# Patient Record
Sex: Female | Born: 1995 | Race: White | Hispanic: No | Marital: Single | Smoking: Never smoker
Health system: Southern US, Community
[De-identification: ages and names within clinical notes are randomized; demographics above are authoritative.]

## PROBLEM LIST (undated history)

## (undated) DIAGNOSIS — J45909 Unspecified asthma, uncomplicated: Secondary | ICD-10-CM

## (undated) DIAGNOSIS — F988 Other specified behavioral and emotional disorders with onset usually occurring in childhood and adolescence: Secondary | ICD-10-CM

## (undated) HISTORY — PX: TYMPANOSTOMY TUBE PLACEMENT: SHX32

## (undated) HISTORY — DX: Other specified behavioral and emotional disorders with onset usually occurring in childhood and adolescence: F98.8

---
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
## 2016-01-07 ENCOUNTER — Other Ambulatory Visit: Payer: Self-pay | Admitting: Nurse Practitioner

## 2016-01-07 NOTE — Telephone Encounter (Signed)
She needs a 3 month recheck - 1 pack can be given until she returns.

## 2016-01-07 NOTE — Telephone Encounter (Signed)
Left detailed  message to call regarding refill request, patient needs follow up before any additional refills -eh

## 2016-01-07 NOTE — Telephone Encounter (Signed)
Medication refill request: OCP Last AEX:  10-30-15  Next AEX: not scheduled  Last MMG (if hormonal medication request): N/A Refill authorized: please advise

## 2016-01-17 ENCOUNTER — Encounter: Payer: Self-pay | Admitting: Certified Nurse Midwife

## 2016-01-17 ENCOUNTER — Ambulatory Visit (INDEPENDENT_AMBULATORY_CARE_PROVIDER_SITE_OTHER): Payer: BLUE CROSS/BLUE SHIELD | Admitting: Certified Nurse Midwife

## 2016-01-17 VITALS — BP 110/70 | HR 80 | Resp 16 | Ht 68.5 in | Wt 131.0 lb

## 2016-01-17 DIAGNOSIS — R300 Dysuria: Secondary | ICD-10-CM | POA: Diagnosis not present

## 2016-01-17 DIAGNOSIS — N3000 Acute cystitis without hematuria: Secondary | ICD-10-CM

## 2016-01-17 DIAGNOSIS — Z3041 Encounter for surveillance of contraceptive pills: Secondary | ICD-10-CM

## 2016-01-17 LAB — POCT URINALYSIS DIPSTICK
Bilirubin, UA: NEGATIVE
Blood, UA: NEGATIVE
Glucose, UA: NEGATIVE
Ketones, UA: NEGATIVE
Nitrite, UA: POSITIVE
Urobilinogen, UA: NEGATIVE
pH, UA: 7

## 2016-01-17 MED ORDER — NORETHIN ACE-ETH ESTRAD-FE 1-20 MG-MCG PO TABS: 1.0000 | ORAL_TABLET | Freq: Every day | ORAL | 3 refills | Status: DC

## 2016-01-17 MED ORDER — NITROFURANTOIN MONOHYD MACRO 100 MG PO CAPS: 100.0000 mg | ORAL_CAPSULE | Freq: Two times a day (BID) | ORAL | 0 refills | Status: DC

## 2016-01-17 MED ORDER — NORETHIN ACE-ETH ESTRAD-FE 1-20 MG-MCG PO TABS: 1.0000 | ORAL_TABLET | Freq: Every day | ORAL | 0 refills | Status: DC

## 2016-01-17 MED ORDER — PHENAZOPYRIDINE HCL 100 MG PO TABS: 100.0000 mg | ORAL_TABLET | Freq: Three times a day (TID) | ORAL | 0 refills | Status: DC | PRN

## 2016-01-17 NOTE — Patient Instructions (Signed)
Urinary Tract Infection, Adult Introduction A urinary tract infection (UTI) is an infection of any part of the urinary tract. The urinary tract includes the:  Kidneys.  Ureters.  Bladder.  Urethra. These organs make, store, and get rid of pee (urine) in the body. Follow these instructions at home:  Take over-the-counter and prescription medicines only as told by your doctor.  If you were prescribed an antibiotic medicine, take it as told by your doctor. Do not stop taking the antibiotic even if you start to feel better.  Avoid the following drinks:  Alcohol.  Caffeine.  Tea.  Carbonated drinks.  Drink enough fluid to keep your pee clear or pale yellow.  Keep all follow-up visits as told by your doctor. This is important.  Make sure to:  Empty your bladder often and completely. Do not to hold pee for long periods of time.  Empty your bladder before and after sex.  Wipe from front to back after a bowel movement if you are female. Use each tissue one time when you wipe. Contact a doctor if:  You have back pain.  You have a fever.  You feel sick to your stomach (nauseous).  You throw up (vomit).  Your symptoms do not get better after 3 days.  Your symptoms go away and then come back. Get help right away if:  You have very bad back pain.  You have very bad lower belly (abdominal) pain.  You are throwing up and cannot keep down any medicines or water. This information is not intended to replace advice given to you by your health care provider. Make sure you discuss any questions you have with your health care provider. Document Released: 06/25/2007 Document Revised: 06/14/2015 Document Reviewed: 11/27/2014  2017 Elsevier  

## 2016-01-17 NOTE — Progress Notes (Signed)
20 y.o.Single Caucasian female G0P0000 here with complaint of UTI, with onset  on 1 week. Patient complaining of:  dysuria. Patient denies fever, chills, nausea or back pain. No new personal products. Patient feels not related to sexual activity. Denies vaginal symptoms.    Contraception is OCP's and condom.  No change in partner. Patient has adequate water intake. Patient took Uricalm yesterday. Patient also here for follow up of OCP initiation 3 months ago. Denies any missed pills, warning signs or side effects with use. Periods 3 day duration, no cramping, light to moderate flow. Happy with choice. Will need one pack until back in [place] at college. No other health issues today.   O: Healthy female WDWN Affect: Normal, orientation x 3 Skin : warm and dry CVAT: negative bilateral Abdomen: poisitive for suprapubic tenderness  Pelvic exam: External genital area: normal, no lesions Bladder,Urethra tender, Urethral meatus: normal Vagina: normal vaginal discharge, normal appearance Cervix: normal, non tender Uterus:normal,non tender Adnexa: normal non tender, no fullness or masses  POCT:  Positive nitrites, 1+ WBC  A:  UTI  Normal pelvic exam  OCP surveillance  P: Reviewed findings of UTI, etiology, and need for treatment. Rx:  Macrobid see order with instructions Rx Pyridium see order with instructions JXB:JYNWGLab:Urine  Culture only Reviewed warning signs and symptoms of UTI and need to advise if occurring. Encouraged to limit soda, tea, and coffee and increase water intake. Reviewed warning signs with OCP and need to advise if occurs. Rx Junel 1/20 Fe see order one pack sent to local pharmacy and [facility]   RV prn

## 2016-01-18 NOTE — Progress Notes (Signed)
Reviewed personally.  M. Suzanne Daemien Fronczak, MD.  

## 2016-01-19 LAB — URINE CULTURE: Colony Count: >=100000

## 2017-06-04 ENCOUNTER — Other Ambulatory Visit: Payer: Self-pay | Admitting: Certified Nurse Midwife

## 2017-06-04 DIAGNOSIS — Z3041 Encounter for surveillance of contraceptive pills: Secondary | ICD-10-CM

## 2017-06-16 ENCOUNTER — Ambulatory Visit: Payer: BLUE CROSS/BLUE SHIELD | Admitting: Certified Nurse Midwife

## 2017-06-16 ENCOUNTER — Other Ambulatory Visit: Payer: Self-pay

## 2017-06-16 ENCOUNTER — Encounter: Payer: Self-pay | Admitting: Certified Nurse Midwife

## 2017-06-16 VITALS — BP 118/80 | HR 72 | Resp 16 | Ht 68.25 in | Wt 121.0 lb

## 2017-06-16 DIAGNOSIS — Z3041 Encounter for surveillance of contraceptive pills: Secondary | ICD-10-CM

## 2017-06-16 DIAGNOSIS — Z01419 Encounter for gynecological examination (general) (routine) without abnormal findings: Secondary | ICD-10-CM | POA: Diagnosis not present

## 2017-06-16 MED ORDER — NORETHIN ACE-ETH ESTRAD-FE 1-20 MG-MCG PO TABS: 1.0000 | ORAL_TABLET | Freq: Every day | ORAL | 4 refills | Status: AC

## 2017-06-16 NOTE — Progress Notes (Signed)
22 y.o. G0P0000 Single  Caucasian Fe here for annual exam. Periods normal, no issues. Contraception condoms, but would like to be back on OCP. Prefers Loestrin 1/20 FE. Had stopped  OCP at one point and restarted, now out of Rx. Sees [facility] prn at college.Has had weight loss over the past year with school stress. Eating much better now and feels she has gained some weight back. Sexually active, no partner change, declines STD screening was done at school. No other health issues today. Planning road trip to [place]!  Patient's last menstrual period was 05/26/2017 (exact date).          Sexually active: Yes.    The current method of family planning is condoms most of the time.  Takes ocp's but none in 2 weeks (ran out)  Exercising: Yes.    walking & gym Smoker:  Marijuana use  Health Maintenance: Pap:  none History of Abnormal Pap: no MMG:  none Self Breast exams: yes Colonoscopy:  none BMD:   none TDaP:  2016 Shingles: no Pneumonia: no Hep C and HIV: not done Labs: no   reports that she has never smoked. She has never used smokeless tobacco. She reports that she drinks about 4.2 oz of alcohol per week. She reports that she has current or past drug history. Drug: Marijuana.  Past Medical History:  Diagnosis Date  . ADD (attention deficit disorder)   . Asthma     Past Surgical History:  Procedure Laterality Date  . TYMPANOSTOMY TUBE PLACEMENT      Current Outpatient Medications  Medication Sig Dispense Refill  . albuterol (PROVENTIL HFA;VENTOLIN HFA) 108 (90 BASE) MCG/ACT inhaler Inhale 2 puffs into the lungs every 6 (six) hours as needed for wheezing.    Marland Kitchen ALVESCO 160 MCG/ACT inhaler Inhale 1 puff into the lungs 2 (two) times daily.     . cetirizine (ZYRTEC) 10 MG tablet Take 10 mg by mouth daily.    Marland Kitchen ibuprofen (ADVIL,MOTRIN) 200 MG tablet Take 200 mg by mouth every 6 (six) hours as needed for pain.    . naproxen sodium (ANAPROX) 220 MG tablet Take 220 mg  by mouth 2 (two) times daily with a meal.    . VYVANSE 60 MG capsule Take 60 mg by mouth daily.     . norethindrone-ethinyl estradiol (JUNEL FE,GILDESS FE,LOESTRIN FE) 1-20 MG-MCG tablet Take 1 tablet by mouth daily. (Patient not taking: Reported on 06/16/2017) 3 Package 3   No current facility-administered medications for this visit.     Family History  Problem Relation Age of Onset  . Cervical cancer Mother   . Hypertension Paternal Grandmother   . Lung cancer Paternal Grandmother   . Hypertension Paternal Grandfather     ROS:  Pertinent items are noted in HPI.  Otherwise, a comprehensive ROS was negative.  Exam:   BP 118/80   Pulse 72   Resp 16   Ht 5' 8.25" (1.734 m)   Wt 121 lb (54.9 kg)   LMP 05/26/2017 (Exact Date)   BMI 18.26 kg/m  Height: 5' 8.25" (173.4 cm) Ht Readings from Last 3 Encounters:  06/16/17 5' 8.25" (1.734 m)  01/17/16 5' 8.5" (1.74 m) (95 %, Z= 1.66)*  10/30/15 5' 8.25" (1.734 m) (94 %, Z= 1.56)*   * Growth percentiles are based on CDC (Girls, 2-20 Years) data.    General appearance: alert, cooperative and appears stated age Head: Normocephalic, without obvious abnormality, atraumatic Neck: no adenopathy, supple, symmetrical, trachea  midline and thyroid normal to inspection and palpation Lungs: clear to auscultation bilaterally Breasts: normal appearance, no masses or tenderness, No nipple retraction or dimpling, No nipple discharge or bleeding, No axillary or supraclavicular adenopathy Heart: regular rate and rhythm Abdomen: soft, non-tender; no masses,  no organomegaly Extremities: extremities normal, atraumatic, no cyanosis or edema Skin: Skin color, texture, turgor normal. No rashes or lesions Lymph nodes: Cervical, supraclavicular, and axillary nodes normal. No abnormal inguinal nodes palpated Neurologic: Grossly normal   Pelvic: External genitalia:  no lesions              Urethra:  normal appearing urethra with no masses, tenderness or  lesions              Bartholin's and Skene's: normal                 Vagina: normal appearing vagina with normal color and discharge, no lesions              Cervix: no cervical motion tenderness, no lesions and nulliparous appearance              Pap taken: No. Bimanual Exam:  Uterus:  normal size, contour, position, consistency, mobility, non-tender and anteverted              Adnexa: normal adnexa and no mass, fullness, tenderness               Rectovaginal: Confirms               Anus:  normal appearance  Chaperone present: yes  A:  Well Woman with normal exam  Contraception OCP desired  Vyvanse/asthma with MD management  Weight loss with college stress  P:   Reviewed health and wellness pertinent to exam  Risks/benefits/warning signs of OCP reviewed, desires continuance. Discussed importance of consistent use for protection and cycle control.  Rx Loestrin 1/20 Fe preferred See order with instructions  Continue follow up with MD as indicated  Discussed importance of good diet and health/well being.  Pap smear: no   counseled on breast self exam, STD prevention, HIV risk factors and prevention, use and side effects of OCP's, adequate intake of calcium and vitamin D, diet and exercise  return annually or prn  An After Visit Summary was printed and given to the patient.

## 2017-06-16 NOTE — Patient Instructions (Addendum)
General topics  Next pap or exam is  due in 1 year Take a Women's multivitamin Take 1200 mg. of calcium daily - prefer dietary If any concerns in interim to call back  Breast Self-Awareness Practicing breast self-awareness may pick up problems early, prevent significant medical complications, and possibly save your life. By practicing breast self-awareness, you can become familiar with how your breasts look and feel and if your breasts are changing. This allows you to notice changes early. It can also offer you some reassurance that your breast health is good. One way to learn what is normal for your breasts and whether your breasts are changing is to do a breast self-exam. If you find a lump or something that was not present in the past, it is best to contact your caregiver right away. Other findings that should be evaluated by your caregiver include nipple discharge, especially if it is bloody; skin changes or reddening; areas where the skin seems to be pulled in (retracted); or new lumps and bumps. Breast pain is seldom associated with cancer (malignancy), but should also be evaluated by a caregiver. BREAST SELF-EXAM The best time to examine your breasts is 5 7 days after your menstrual period is over.  ExitCare Patient Information 2013 [employer].   Exercise to Stay Healthy Exercise helps you become and stay healthy. EXERCISE IDEAS AND TIPS Choose exercises that:  You enjoy.  Fit into your day. You do not need to exercise really hard to be healthy. You can do exercises at a slow or medium level and stay healthy. You can:  Stretch before and after working out.  Try yoga, Pilates, or tai chi.  Lift weights.  Walk fast, swim, jog, run, climb stairs, bicycle, dance, or rollerskate.  Take aerobic classes. Exercises that burn about 150 calories:  Running 1  miles in 15 minutes.  Playing volleyball for 45 to 60 minutes.  Washing and waxing a car for 45 to 60  minutes.  Playing touch football for 45 minutes.  Walking 1  miles in 35 minutes.  Pushing a stroller 1  miles in 30 minutes.  Playing basketball for 30 minutes.  Raking leaves for 30 minutes.  Bicycling 5 miles in 30 minutes.  Walking 2 miles in 30 minutes.  Dancing for 30 minutes.  Shoveling snow for 15 minutes.  Swimming laps for 20 minutes.  Walking up stairs for 15 minutes.  Bicycling 4 miles in 15 minutes.  Gardening for 30 to 45 minutes.  Jumping rope for 15 minutes.  Washing windows or floors for 45 to 60 minutes. Document Released: 02/08/2010 Document Revised: 03/31/2011 Document Reviewed: 02/08/2010 [employer] Patient Information 2013 [employer].   Other topics ( that may be useful information):    Sexually Transmitted Disease Sexually transmitted disease (STD) refers to any infection that is passed from person to person during sexual activity. This may happen by way of saliva, semen, blood, vaginal mucus, or urine. Common STDs include:  Gonorrhea.  Chlamydia.  Syphilis.  HIV/AIDS.  Genital herpes.  Hepatitis B and C.  Trichomonas.  Human papillomavirus (HPV).  Pubic lice. CAUSES  An STD may be spread by bacteria, virus, or parasite. A person can get an STD by:  Sexual intercourse with an infected person.  Sharing sex toys with an infected person.  Sharing needles with an infected person.  Having intimate contact with the genitals, mouth, or rectal areas of an infected person. SYMPTOMS  Some people may not have any symptoms, but   they can still pass the infection to others. Different STDs have different symptoms. Symptoms include:  Painful or bloody urination.  Pain in the pelvis, abdomen, vagina, anus, throat, or eyes.  Skin rash, itching, irritation, growths, or sores (lesions). These usually occur in the genital or anal area.  Abnormal vaginal discharge.  Penile discharge in men.  Soft, flesh-colored skin growths in the  genital or anal area.  Fever.  Pain or bleeding during sexual intercourse.  Swollen glands in the groin area.  Yellow skin and eyes (jaundice). This is seen with hepatitis. DIAGNOSIS  To make a diagnosis, your caregiver may:  Take a medical history.  Perform a physical exam.  Take a specimen (culture) to be examined.  Examine a sample of discharge under a microscope.  Perform blood test TREATMENT   Chlamydia, gonorrhea, trichomonas, and syphilis can be cured with antibiotic medicine.  Genital herpes, hepatitis, and HIV can be treated, but not cured, with prescribed medicines. The medicines will lessen the symptoms.  Genital warts from HPV can be treated with medicine or by freezing, burning (electrocautery), or surgery. Warts may come back.  HPV is a virus and cannot be cured with medicine or surgery.However, abnormal areas may be followed very closely by your caregiver and may be removed from the cervix, vagina, or vulva through office procedures or surgery. If your diagnosis is confirmed, your recent sexual partners need treatment. This is true even if they are symptom-free or have a negative culture or evaluation. They should not have sex until their caregiver says it is okay. HOME CARE INSTRUCTIONS  All sexual partners should be informed, tested, and treated for all STDs.  Take your antibiotics as directed. Finish them even if you start to feel better.  Only take over-the-counter or prescription medicines for pain, discomfort, or fever as directed by your caregiver.  Rest.  Eat a balanced diet and drink enough fluids to keep your urine clear or pale yellow.  Do not have sex until treatment is completed and you have followed up with your caregiver. STDs should be checked after treatment.  Keep all follow-up appointments, Pap tests, and blood tests as directed by your caregiver.  Only use latex condoms and water-soluble lubricants during sexual activity. Do not use  petroleum jelly or oils.  Avoid alcohol and illegal drugs.  Get vaccinated for HPV and hepatitis. If you have not received these vaccines in the past, talk to your caregiver about whether one or both might be right for you.  Avoid risky sex practices that can break the skin. The only way to avoid getting an STD is to avoid all sexual activity.Latex condoms and dental dams (for oral sex) will help lessen the risk of getting an STD, but will not completely eliminate the risk. SEEK MEDICAL CARE IF:   You have a fever.  You have any new or worsening symptoms. Document Released: 03/29/2002 Document Revised: 03/31/2011 Document Reviewed: 04/05/2010 Exit[employer] Patient Information 2013 [employer].    Domestic Abuse You are being battered or abused if someone close to you hits, pushes, or physically hurts you in any way. You also are being abused if you are forced into activities. You are being sexually abused if you are forced to have sexual contact of any kind. You are being emotionally abused if you are made to feel worthless or if you are constantly threatened. It is important to remember that help is available. No one has the right to abuse you. PREVENTION OF FURTHER   ABUSE  Learn the warning signs of danger. This varies with situations but may include: the use of alcohol, threats, isolation from friends and family, or forced sexual contact. Leave if you feel that violence is going to occur.  If you are attacked or beaten, report it to the police so the abuse is documented. You do not have to press charges. The police can protect you while you or the attackers are leaving. Get the officer's name and badge number and a copy of the report.  Find someone you can trust and tell them what is happening to you: your caregiver, a nurse, clergy member, close friend or family member. Feeling ashamed is natural, but remember that you have done nothing wrong. No one deserves abuse. Document Released:  01/04/2000 Document Revised: 03/31/2011 Document Reviewed: 03/14/2010 Exit[employer] Patient Information 2013 [employer].    How Much is Too Much Alcohol? Drinking too much alcohol can cause injury, accidents, and health problems. These types of problems can include:   Car crashes.  Falls.  Family fighting (domestic violence).  Drowning.  Fights.  Injuries.  Burns.  Damage to certain organs.  Having a baby with birth defects. ONE DRINK CAN BE TOO MUCH WHEN YOU ARE:  Working.  Pregnant or breastfeeding.  Taking medicines. Ask your doctor.  Driving or planning to drive. If you or someone you know has a drinking problem, get help from a doctor.  Document Released: 11/02/2008 Document Revised: 03/31/2011 Document Reviewed: 11/02/2008 Exit[employer] Patient Information 2013 [employer].   Smoking Hazards Smoking cigarettes is extremely bad for your health. Tobacco smoke has over 200 known poisons in it. There are over 60 chemicals in tobacco smoke that cause cancer. Some of the chemicals found in cigarette smoke include:   Cyanide.  Benzene.  Formaldehyde.  Methanol (wood alcohol).  Acetylene (fuel used in welding torches).  Ammonia. Cigarette smoke also contains the poisonous gases nitrogen oxide and carbon monoxide.  Cigarette smokers have an increased risk of many serious medical problems and Smoking causes approximately:  90% of all lung cancer deaths in men.  80% of all lung cancer deaths in women.  90% of deaths from chronic obstructive lung disease. Compared with nonsmokers, smoking increases the risk of:  Coronary heart disease by 2 to 4 times.  Stroke by 2 to 4 times.  Men developing lung cancer by 23 times.  Women developing lung cancer by 13 times.  Dying from chronic obstructive lung diseases by 12 times.  . Smoking is the most preventable cause of death and disease in our society.  WHY IS SMOKING ADDICTIVE?  Nicotine is the chemical  agent in tobacco that is capable of causing addiction or dependence.  When you smoke and inhale, nicotine is absorbed rapidly into the bloodstream through your lungs. Nicotine absorbed through the lungs is capable of creating a powerful addiction. Both inhaled and non-inhaled nicotine may be addictive.  Addiction studies of cigarettes and spit tobacco show that addiction to nicotine occurs mainly during the teen years, when young people begin using tobacco products. WHAT ARE THE BENEFITS OF QUITTING?  There are many health benefits to quitting smoking.   Likelihood of developing cancer and heart disease decreases. Health improvements are seen almost immediately.  Blood pressure, pulse rate, and breathing patterns start returning to normal soon after quitting. QUITTING SMOKING   American Lung Association - 1-800-LUNGUSA  American Cancer Society - 1-800-ACS-2345 Document Released: 02/14/2004 Document Revised: 03/31/2011 Document Reviewed: 10/18/2008 ExitCare Patient Information 2013 [employer].   Stress Management Stress is a state of physical or mental tension that often results from changes in your life or normal routine. Some common causes of stress are:  Death of a loved one.  Injuries or severe illnesses.  Getting fired or changing jobs.  Moving into a new home. Other causes may be:  Sexual problems.  Business or financial losses.  Taking on a large debt.  Regular conflict with someone at home or at work.  Constant tiredness from lack of sleep. It is not just bad things that are stressful. It may be stressful to:  Win the lottery.  Get married.  Buy a new car. The amount of stress that can be easily tolerated varies from person to person. Changes generally cause stress, regardless of the types of change. Too much stress can affect your health. It may lead to physical or emotional problems. Too little stress (boredom) may also become stressful. SUGGESTIONS TO  REDUCE STRESS:  Talk things over with your family and friends. It often is helpful to share your concerns and worries. If you feel your problem is serious, you may want to get help from a professional counselor.  Consider your problems one at a time instead of lumping them all together. Trying to take care of everything at once may seem impossible. List all the things you need to do and then start with the most important one. Set a goal to accomplish 2 or 3 things each day. If you expect to do too many in a single day you will naturally fail, causing you to feel even more stressed.  Do not use alcohol or drugs to relieve stress. Although you may feel better for a short time, they do not remove the problems that caused the stress. They can also be habit forming.  Exercise regularly - at least 3 times per week. Physical exercise can help to relieve that "uptight" feeling and will relax you.  The shortest distance between despair and hope is often a good night's sleep.  Go to bed and get up on time allowing yourself time for appointments without being rushed.  Take a short "time-out" period from any stressful situation that occurs during the day. Close your eyes and take some deep breaths. Starting with the muscles in your face, tense them, hold it for a few seconds, then relax. Repeat this with the muscles in your neck, shoulders, hand, stomach, back and legs.  Take good care of yourself. Eat a balanced diet and get plenty of rest.  Schedule time for having fun. Take a break from your daily routine to relax. HOME CARE INSTRUCTIONS   Call if you feel overwhelmed by your problems and feel you can no longer manage them on your own.  Return immediately if you feel like hurting yourself or someone else. Document Released: 07/02/2000 Document Revised: 03/31/2011 Document Reviewed: 02/22/2007 ExitCare Patient Information 2013 [employer].   Oral Contraception Use Oral contraceptive pills  (OCPs) are medicines taken to prevent pregnancy. OCPs work by preventing the ovaries from releasing eggs. The hormones in OCPs also cause the cervical mucus to thicken, preventing the sperm from entering the uterus. The hormones also cause the uterine lining to become thin, not allowing a fertilized egg to attach to the inside of the uterus. OCPs are highly effective when taken exactly as prescribed. However, OCPs do not prevent sexually transmitted diseases (STDs). Safe sex practices, such as using condoms along with an OCP, can help prevent STDs.   Before taking OCPs, you may have a physical exam and Pap test. Your health care provider may also order blood tests if necessary. Your health care provider will make sure you are a good candidate for oral contraception. Discuss with your health care provider the possible side effects of the OCP you may be prescribed. When starting an OCP, it can take 2 to 3 months for the body to adjust to the changes in hormone levels in your body. How to take oral contraceptive pills Your health care provider may advise you on how to start taking the first cycle of OCPs. Otherwise, you can:  Start on day 1 of your menstrual period. You will not need any backup contraceptive protection with this start time.  Start on the first Sunday after your menstrual period or the day you get your prescription. In these cases, you will need to use backup contraceptive protection for the first week.  Start the pill at any time of your cycle. If you take the pill within 5 days of the start of your period, you are protected against pregnancy right away. In this case, you will not need a backup form of birth control. If you start at any other time of your menstrual cycle, you will need to use another form of birth control for 7 days. If your OCP is the type called a minipill, it will protect you from pregnancy after taking it for 2 days (48 hours).  After you have started taking OCPs:  If  you forget to take 1 pill, take it as soon as you remember. Take the next pill at the regular time.  If you miss 2 or more pills, call your health care provider because different pills have different instructions for missed doses. Use backup birth control until your next menstrual period starts.  If you use a 28-day pack that contains inactive pills and you miss 1 of the last 7 pills (pills with no hormones), it will not matter. Throw away the rest of the non-hormone pills and start a new pill pack.  No matter which day you start the OCP, you will always start a new pack on that same day of the week. Have an extra pack of OCPs and a backup contraceptive method available in case you miss some pills or lose your OCP pack. Follow these instructions at home:  Do not smoke.  Always use a condom to protect against STDs. OCPs do not protect against STDs.  Use a calendar to mark your menstrual period days.  Read the information and directions that came with your OCP. Talk to your health care provider if you have questions. Contact a health care provider if:  You develop nausea and vomiting.  You have abnormal vaginal discharge or bleeding.  You develop a rash.  You miss your menstrual period.  You are losing your hair.  You need treatment for mood swings or depression.  You get dizzy when taking the OCP.  You develop acne from taking the OCP.  You become pregnant. Get help right away if:  You develop chest pain.  You develop shortness of breath.  You have an uncontrolled or severe headache.  You develop numbness or slurred speech.  You develop visual problems.  You develop pain, redness, and swelling in the legs. This information is not intended to replace advice given to you by your health care provider. Make sure you discuss any questions you have with your health care provider. Document Released: 12/26/2010   Document Revised: 06/14/2015 Document Reviewed:  06/27/2012 [employer] Interactive Patient Education  2017 [employer]  

## 2019-04-13 ENCOUNTER — Encounter: Payer: Self-pay | Admitting: Certified Nurse Midwife
